# Patient Record
Sex: Female | Born: 1975 | Race: White | Hispanic: No | Marital: Married | Smoking: Former smoker
Health system: Southern US, Community
[De-identification: ages and names within clinical notes are randomized; demographics above are authoritative.]

## PROBLEM LIST (undated history)

## (undated) DIAGNOSIS — Z8619 Personal history of other infectious and parasitic diseases: Secondary | ICD-10-CM

## (undated) DIAGNOSIS — O139 Gestational [pregnancy-induced] hypertension without significant proteinuria, unspecified trimester: Secondary | ICD-10-CM

## (undated) DIAGNOSIS — M1990 Unspecified osteoarthritis, unspecified site: Secondary | ICD-10-CM

## (undated) DIAGNOSIS — E785 Hyperlipidemia, unspecified: Secondary | ICD-10-CM

## (undated) DIAGNOSIS — Z87440 Personal history of urinary (tract) infections: Secondary | ICD-10-CM

## (undated) HISTORY — DX: Hyperlipidemia, unspecified: E78.5

## (undated) HISTORY — DX: Gestational (pregnancy-induced) hypertension without significant proteinuria, unspecified trimester: O13.9

## (undated) HISTORY — PX: OTHER SURGICAL HISTORY: SHX169

## (undated) HISTORY — DX: Unspecified osteoarthritis, unspecified site: M19.90

## (undated) HISTORY — DX: Personal history of urinary (tract) infections: Z87.440

## (undated) HISTORY — DX: Personal history of other infectious and parasitic diseases: Z86.19

---

## 2006-03-16 ENCOUNTER — Encounter: Admission: RE | Admit: 2006-03-16 | Discharge: 2006-03-16 | Payer: Self-pay | Admitting: *Deleted

## 2006-08-14 ENCOUNTER — Encounter: Admission: RE | Admit: 2006-08-14 | Discharge: 2006-08-14 | Payer: Self-pay | Admitting: *Deleted

## 2007-02-13 ENCOUNTER — Encounter: Admission: RE | Admit: 2007-02-13 | Discharge: 2007-02-13 | Payer: Self-pay | Admitting: Obstetrics and Gynecology

## 2007-07-10 ENCOUNTER — Encounter: Admission: RE | Admit: 2007-07-10 | Discharge: 2007-07-10 | Payer: Self-pay | Admitting: Obstetrics and Gynecology

## 2009-03-27 ENCOUNTER — Inpatient Hospital Stay (HOSPITAL_COMMUNITY): Admission: AD | Admit: 2009-03-27 | Discharge: 2009-03-31 | Payer: Self-pay | Admitting: Obstetrics and Gynecology

## 2009-03-28 ENCOUNTER — Encounter (INDEPENDENT_AMBULATORY_CARE_PROVIDER_SITE_OTHER): Payer: Self-pay | Admitting: Obstetrics and Gynecology

## 2010-09-26 ENCOUNTER — Encounter: Payer: Self-pay | Admitting: *Deleted

## 2010-11-23 LAB — HEPATITIS B SURFACE ANTIGEN: Hepatitis B Surface Ag: NEGATIVE

## 2010-11-23 LAB — RUBELLA ANTIBODY, IGM: Rubella: IMMUNE

## 2010-11-23 LAB — ANTIBODY SCREEN: Antibody Screen: NEGATIVE

## 2010-12-12 LAB — CBC
HCT: 37.6 % (ref 36.0–46.0)
MCHC: 34.7 g/dL (ref 30.0–36.0)
MCHC: 35.4 g/dL (ref 30.0–36.0)
MCV: 90.6 fL (ref 78.0–100.0)
RBC: 3.42 MIL/uL — ABNORMAL LOW (ref 3.87–5.11)
RDW: 12.9 % (ref 11.5–15.5)
RDW: 13.3 % (ref 11.5–15.5)
WBC: 10.5 10*3/uL (ref 4.0–10.5)
WBC: 7.7 10*3/uL (ref 4.0–10.5)

## 2011-01-18 NOTE — H&P (Signed)
Natasha Wallace, Natasha Wallace                ACCOUNT NO.:  [id number]   MEDICAL RECORD NO.:  [id number]          PATIENT TYPE:  INP   LOCATION:                                FACILITY:  [facility]   PHYSICIAN:  Lenoard Aden, M.D.DATE OF BIRTH:  02-27-1976   DATE OF ADMISSION:  03/27/2009  DATE OF DISCHARGE:                              HISTORY & PHYSICAL   CHIEF COMPLAINT:  LGA with Bishop score greater than 8 for induction.   HISTORY OF PRESENT ILLNESS:  She is a 35 year old, white female, G2, P0  at 40 weeks and 0 days, who presents for Cervidil.  She has a history of  an estimated fetal weight greater than the 90th percentile, now  presenting for cervical ripening and induction.   ALLERGIES:  She has no known drug allergies.  No known LATEX allergies.   MEDICATIONS:  Prenatal vitamins are her only medication.   FAMILY HISTORY:  Hypertension, breast cancer, COPD.   SOCIAL HISTORY:  She is a nonsmoker, nondrinker.  She denies domestic or  physical violence.   SURGICAL HISTORY:  She has no relevant surgical history.  She has a  history of 1 TAB.   PRENATAL COURSE:  Complicated by size discrepancy with an estimated  fetal with persistent delay in the 90th percentile.  Most recent  ultrasound with an estimated fetal weight of 4000 g approximately 9  pound.   PHYSICAL EXAMINATION:  GENERAL:  She is a well-developed, well-  nourished, white female, in no acute distress.  HEENT:  Normal.  LUNGS:  Clear.  HEART:  Regular rate and rhythm.  ABDOMEN:  Soft, gravid, nontender.  Estimated fetal weight 9-9-1/2  pounds.  Cervix 2 cm, 70% vertex -1.  EXTREMITIES:  No cords.  NEURO:  Nonfocal.  SKIN:  Intact.   Cervidil was placed.  NST is reactive.   IMPRESSION:  A 40-week intrauterine pregnancy with Bishop score greater  than 8.  Estimated fetal weight greater than 90th percentile for  attempts at vaginal delivery.   PLAN:  Proceed with cautious attempts at vaginal delivery, epidural as  needed.      Lenoard Aden, M.D.  Electronically Signed     RJT/MEDQ  D:  03/27/2009  T:  03/28/2009  Job:  [id number]

## 2011-01-18 NOTE — Discharge Summary (Signed)
NAMESALONI, Natasha Wallace                ACCOUNT NO.:  [id number]   MEDICAL RECORD NO.:  [id number]          PATIENT TYPE:  INP   LOCATION:  9124                          [facility]ILITY:  [facility]   PHYSICIAN:  Lenoard Aden, M.D.DATE OF BIRTH:  May 07, 1976   DATE OF ADMISSION:  03/27/2009  DATE OF DISCHARGE:  03/31/2009                               DISCHARGE SUMMARY   The patient is a gravida 2, para 0 at [redacted] weeks gestation for induction  of labor, LGA at 90th percentile with an estimated fetal weight of 9  pounds.  Bishop score of 8, reactive NST.  The patient has received  prenatal care [facility] with Dr. Billy Coast as primary.  Pregnancy has  been uncomplicated except for measuring large for gestational age.  The  patient is B+, rubella positive, hepatitis negative, HIV negative, RPR  negative, and GBS negative.  The patient was admitted on March 27, 2009,  for induction of labor with Cervidil cervical ripening.  On March 28, 2009, the patient underwent artificial rupture of membranes at 4 cm  dilation at 10:15 a.m.  IUPC was placed.  The patient underwent Pitocin  augmentation.  At 14:30 decision for cesarean section for repetitive  variable decels, nonreassuring fetal heart rate, intolerance to Pitocin  augmentation.  See operative report by Dr. Billy Coast.  A viable female  delivered.  Birth weight of 9 pounds 15 ounces.   POSTOPERATIVE COURSE:  Postoperative course was uneventful.  Vital signs  remained normal limits.  The patient is afebrile during postoperative  stay.  Vital signs on day of discharge include temperature of 97.5,  pulse 83, respirations 18, and blood pressure 111/73.  Postoperative  CBC, white blood cell count of 10.5, hemoglobin 11, hematocrit 31, and a  platelet count of 170.  The patient was discharged home on postoperative  day #3 in stable condition.  The patient is ambulating, tolerating diet,  voiding quantity sufficient.  Physical exam is within normal limits.  Incision is intact with staples with no erythema and no drainage.  Staples were removed.  Edges were well approximated and Steri-Strips  were applied.   DISCHARGE INSTRUCTIONS:  Instructions per [facility] discharge  booklet.  The patient is on a regular diet.  Activity per postoperative  instruction.   DISCHARGE MEDICATIONS:  Medications at time of discharge include:  1. Prenatal vitamin 1 p.o. daily.  2. Motrin over the counter 2-4 tablets every 8 hours as needed for      pain p.r.n.  3. Percocet 1-2 tablets p.o. q.6 h. as needed for pain p.r.n.   The patient to remain in hospital, remaining with newborn on  postoperative day #4.  The patient was officially discharged on  postoperative day #3 in stable condition.     Marlinda Mike, C.N.M.      Lenoard Aden, M.D.  Electronically Signed   TB/MEDQ  D:  03/31/2009  T:  03/31/2009  Job:  [id number]

## 2011-01-18 NOTE — Op Note (Signed)
Natasha Wallace, Natasha Wallace                ACCOUNT NO.:  [id number]   MEDICAL RECORD NO.:  [id number]          PATIENT TYPE:  INP   LOCATION:  9124                          [facility]ILITY:  [facility]   PHYSICIAN:  Lenoard Aden, M.D.DATE OF BIRTH:  07-18-1976   DATE OF PROCEDURE:  03/28/2009  DATE OF DISCHARGE:                               OPERATIVE REPORT   PREOPERATIVE DIAGNOSES:  Large for gestational-age fetus, 40-week  intrauterine pregnancy, nonreassuring fetal heart rate tracing with  fetal bradycardia.   POSTOPERATIVE DIAGNOSES:  Large for gestational-age fetus, 40-week  intrauterine pregnancy, nonreassuring fetal heart rate tracing with  fetal bradycardia.   PROCEDURE:  Urgent low segment transverse cesarean section.   SURGEON:  Lenoard Aden, MD   ASSISTANT:  Dineen Kid. Rana Snare, MD   ANESTHESIA:  Epidural by Dr. Arby Barrette.   ESTIMATED BLOOD LOSS:  1000 mL.   COMPLICATIONS:  None.   DRAINS:  Foley.   COUNTS:  Correct.   The patient went to recovery in good condition.   FINDINGS:  Full-term living female, occiput anterior position, Apgars  pending.  Cord pH 7.16. Posterior placenta.  Normal tubes.  Normal  ovaries.  Two-layer uterine closure.   BRIEF OPERATIVE NOTE:  After being apprised of risks of anesthesia,  infection, bleeding, injury to abdominal organs, need for repair,  delayed versus immediate complications to include bowel and bladder  injury.  The patient was brought urgently to the operating room where  she was administered a dosing of epidural anesthetic.  Fetal heart tones  auscultated in 60-90 beats per minute range.  She was quickly prepped  and draped in the usual sterile fashion.  Foley catheter previously  placed.  Pfannenstiel incision was made with scalpel, carried down to  fascia, which was nicked in the midline and opened transverse using Mayo  scissors.  Rectus muscles were dissected bluntly.  Peritoneum was  entered bluntly.  Bladder was blade placed.   Visceral peritoneum scored  sharply off the lower uterine segment.  Kerr hysterotomy was incision  made, atraumatic delivery, 9 pounds 15 ounces female.  Apgars pending,  handed to pediatricians in attendance.  Cord blood collected.  Cord pH  7.16.  Placenta delivered manually intact, sent to Pathology.  Uterus  exteriorized, curetted using a dry lap pack.  Uterine atony was curetted  using a dilute Pitocin solution and intramuscular Methergine.  Good  hemostasis noted.  Irrigation was accomplished.  Two-layer uterine  closure with a 0 Monocryl suture running layer, second imbricating layer  was placed.  Bladder flap was inspected, found to be hemostatic.  Irrigation was accomplished.  Peritoneum was closed using 2-0 chromic in  a  continuous running fashion and 0 Monocryl was used to close the fascia  in a continuous running fashion.  Subcutaneous tissue was reapproximated  using 0 plain in a continuous running fashion.  Skin was closed using  staples.  The patient tolerated the procedure well and to recovery in  good condition.      Lenoard Aden, M.D.  Electronically Signed     RJT/MEDQ  D:  03/28/2009  T:  03/28/2009  Job:  [id number]

## 2011-06-23 ENCOUNTER — Other Ambulatory Visit: Payer: Self-pay | Admitting: Obstetrics and Gynecology

## 2011-06-29 ENCOUNTER — Encounter (HOSPITAL_COMMUNITY): Payer: Self-pay

## 2011-06-29 NOTE — Patient Instructions (Signed)
   Your procedure is scheduled WU:[id number], October 30th  Enter through the Main Entrance of [facility] at:9:30am Pick up the phone at the desk and dial [phone] and inform us of your arrival.  Please call this number if you have any problems the morning of surgery: [phone]  Remember: Do not eat food after midnight:Monday Do not drink clear liquids after:midnight Monday Take these medicines the morning of surgery with a SIP OF WATER:pepcid  Do not wear jewelry, make-up, or FINGER nail polish Do not wear lotions, powders, or perfumes.  You may not wear deodorant. Do not shave 48 hours prior to surgery. Do not bring valuables to the hospital.  Leave suitcase in the car. After Surgery it may be brought to your room. For patients being admitted to the hospital, checkout time is 11:00am the day of discharge.  Patients dis  Remember to use your hibiclens as instructed.Please shower with 1/2 bottle the evening before your surgery and the other 1/2 bottle the morning of surgery.

## 2011-07-01 ENCOUNTER — Encounter (HOSPITAL_COMMUNITY): Payer: Self-pay

## 2011-07-01 ENCOUNTER — Encounter (HOSPITAL_COMMUNITY)
Admission: RE | Admit: 2011-07-01 | Discharge: 2011-07-01 | Disposition: A | Discharge status: Home / Self Care | Payer: 59 | Source: Ambulatory Visit | Attending: Obstetrics and Gynecology | Admitting: Obstetrics and Gynecology

## 2011-07-01 LAB — CBC
HCT: 38.1 % (ref 36.0–46.0)
MCHC: 33.9 g/dL (ref 30.0–36.0)
MCV: 86.6 fL (ref 78.0–100.0)
RBC: 4.4 MIL/uL (ref 3.87–5.11)
WBC: 7.8 10*3/uL (ref 4.0–10.5)

## 2011-07-01 LAB — SURGICAL PCR SCREEN: Staphylococcus aureus: NEGATIVE

## 2011-07-04 MED ORDER — CEFAZOLIN SODIUM-DEXTROSE 2-3 GM-% IV SOLR
2.0000 g | INTRAVENOUS | Status: AC
  Administered 2011-07-05: 2 g via INTRAVENOUS
  Filled 2011-07-04: qty 50

## 2011-07-04 NOTE — H&P (Signed)
Natasha Wallace, Natasha Wallace                ACCOUNT NO.:  [id number]  MEDICAL RECORD NO.:  [id number]  LOCATION:                                 FACILITY:  PHYSICIAN:  Lenoard Aden, M.D.DATE OF BIRTH:  August 25, 1976  DATE OF ADMISSION:  07/05/2011 DATE OF DISCHARGE:                             HISTORY & PHYSICAL   CHIEF COMPLAINT:  Previous C-section for elective repeat at 39 weeks.  HISTORY OF PRESENT ILLNESS:  A 35 year old white female, G3, P1, at 68 weeks' gestation for repeat low-segment transverse cesarean section.  She has no known drug allergies.  Medications are prenatal vitamins.  She has a family history of liver cancer, COPD, chronic hypertension, and breast cancer.  She is a nonsmoker, nondrinker.  She denies domestic violence.  Previous TAB at 12 weeks.  Previous primary C-section in 2010.  PHYSICAL EXAMINATION:  GENERAL:  This is a well-developed, well- nourished, white female, in no acute distress. HEENT:  Normal. NECK:  Supple.  Full range of motion. LUNGS:  Clear. HEART:  Regular rhythm. ABDOMEN:  Soft, gravid, nontender.  Estimated fetal weight 8 pounds. Cervix is closed, 50%, vertex -2. EXTREMITIES:  No cords. NEUROLOGIC:  Nonfocal. SKIN:  Intact.  IMPRESSION:  A 39-week intrauterine pregnancy for elective repeat C- section.  PLAN:  Elective repeat C-section.  Risks of anesthesia, infection, bleeding, injury to abdominal organs, need for repair was discussed, delayed versus immediate complications to include bowel and bladder injury noted.  The patient acknowledges and wishes to proceed.     Lenoard Aden, M.D.     RJT/MEDQ  D:  07/04/2011  T:  07/04/2011  Job:  [id number]  cc:   Ma Hillock

## 2011-07-05 ENCOUNTER — Encounter (HOSPITAL_COMMUNITY): Payer: Self-pay | Admitting: Anesthesiology

## 2011-07-05 ENCOUNTER — Inpatient Hospital Stay (HOSPITAL_COMMUNITY)
Admission: RE | Admit: 2011-07-05 | Discharge: 2011-07-07 | DRG: 766 | Disposition: A | Discharge status: Home / Self Care | Payer: 59 | Source: Ambulatory Visit | Attending: Obstetrics and Gynecology | Admitting: Obstetrics and Gynecology

## 2011-07-05 ENCOUNTER — Encounter (HOSPITAL_COMMUNITY): Payer: Self-pay | Admitting: *Deleted

## 2011-07-05 ENCOUNTER — Encounter (HOSPITAL_COMMUNITY)
Admission: RE | Disposition: A | Discharge status: Home / Self Care | Payer: Self-pay | Source: Ambulatory Visit | Attending: Obstetrics and Gynecology

## 2011-07-05 ENCOUNTER — Inpatient Hospital Stay (HOSPITAL_COMMUNITY): Payer: 59 | Admitting: Anesthesiology

## 2011-07-05 DIAGNOSIS — Z01812 Encounter for preprocedural laboratory examination: Secondary | ICD-10-CM

## 2011-07-05 DIAGNOSIS — O34219 Maternal care for unspecified type scar from previous cesarean delivery: Principal | ICD-10-CM | POA: Diagnosis present

## 2011-07-05 DIAGNOSIS — Z01818 Encounter for other preprocedural examination: Secondary | ICD-10-CM

## 2011-07-05 LAB — RPR: RPR Ser Ql: NONREACTIVE

## 2011-07-05 SURGERY — Surgical Case
Anesthesia: Spinal | Site: Uterus | Wound class: Clean Contaminated

## 2011-07-05 MED ORDER — BUPIVACAINE HCL (PF) 0.25 % IJ SOLN
INTRAMUSCULAR | Status: DC | PRN
  Administered 2011-07-05: 10 mL

## 2011-07-05 MED ORDER — KETOROLAC TROMETHAMINE 30 MG/ML IJ SOLN: 30.0000 mg | Freq: Four times a day (QID) | INTRAMUSCULAR | Status: AC | PRN

## 2011-07-05 MED ORDER — ONDANSETRON HCL 4 MG/2ML IJ SOLN
INTRAMUSCULAR | Status: AC
  Filled 2011-07-05: qty 2

## 2011-07-05 MED ORDER — NALBUPHINE HCL 10 MG/ML IJ SOLN
5.0000 mg | INTRAMUSCULAR | Status: DC | PRN
  Administered 2011-07-05 – 2011-07-06 (×2): 10 mg via INTRAVENOUS
  Filled 2011-07-05 (×2): qty 1

## 2011-07-05 MED ORDER — BUPIVACAINE IN DEXTROSE 0.75-8.25 % IT SOLN
INTRATHECAL | Status: DC | PRN
  Administered 2011-07-05: 1.6 mL via INTRATHECAL

## 2011-07-05 MED ORDER — OXYTOCIN 10 UNIT/ML IJ SOLN
INTRAMUSCULAR | Status: AC
  Filled 2011-07-05: qty 2

## 2011-07-05 MED ORDER — SODIUM CHLORIDE 0.9 % IV SOLN: 1.0000 ug/kg/h | INTRAVENOUS | Status: DC | PRN

## 2011-07-05 MED ORDER — LANOLIN HYDROUS EX OINT: 1.0000 "application " | TOPICAL_OINTMENT | CUTANEOUS | Status: DC | PRN

## 2011-07-05 MED ORDER — METOCLOPRAMIDE HCL 5 MG/ML IJ SOLN: 10.0000 mg | Freq: Three times a day (TID) | INTRAMUSCULAR | Status: DC | PRN

## 2011-07-05 MED ORDER — SIMETHICONE 80 MG PO CHEW: 80.0000 mg | CHEWABLE_TABLET | ORAL | Status: DC | PRN

## 2011-07-05 MED ORDER — ZOLPIDEM TARTRATE 5 MG PO TABS: 5.0000 mg | ORAL_TABLET | Freq: Every evening | ORAL | Status: DC | PRN

## 2011-07-05 MED ORDER — DIBUCAINE 1 % RE OINT: 1.0000 "application " | TOPICAL_OINTMENT | RECTAL | Status: DC | PRN

## 2011-07-05 MED ORDER — EPHEDRINE 5 MG/ML INJ
INTRAVENOUS | Status: AC
  Filled 2011-07-05: qty 10

## 2011-07-05 MED ORDER — MORPHINE SULFATE 0.5 MG/ML IJ SOLN
INTRAMUSCULAR | Status: AC
  Filled 2011-07-05: qty 10

## 2011-07-05 MED ORDER — SIMETHICONE 80 MG PO CHEW
80.0000 mg | CHEWABLE_TABLET | Freq: Three times a day (TID) | ORAL | Status: DC
  Administered 2011-07-05 – 2011-07-07 (×6): 80 mg via ORAL

## 2011-07-05 MED ORDER — TETANUS-DIPHTH-ACELL PERTUSSIS 5-2.5-18.5 LF-MCG/0.5 IM SUSP: 0.5000 mL | Freq: Once | INTRAMUSCULAR | Status: DC

## 2011-07-05 MED ORDER — ONDANSETRON HCL 4 MG/2ML IJ SOLN
INTRAMUSCULAR | Status: DC | PRN
  Administered 2011-07-05: 4 mg via INTRAVENOUS

## 2011-07-05 MED ORDER — METHYLERGONOVINE MALEATE 0.2 MG/ML IJ SOLN: 0.2000 mg | INTRAMUSCULAR | Status: DC | PRN

## 2011-07-05 MED ORDER — LACTATED RINGERS IV SOLN
INTRAVENOUS | Status: DC
  Administered 2011-07-05: 19:00:00 via INTRAVENOUS

## 2011-07-05 MED ORDER — NALOXONE HCL 0.4 MG/ML IJ SOLN: 0.4000 mg | INTRAMUSCULAR | Status: DC | PRN

## 2011-07-05 MED ORDER — SODIUM CHLORIDE 0.9 % IJ SOLN: 3.0000 mL | INTRAMUSCULAR | Status: DC | PRN

## 2011-07-05 MED ORDER — OXYTOCIN 20 UNITS IN LACTATED RINGERS INFUSION - SIMPLE
INTRAVENOUS | Status: DC | PRN
  Administered 2011-07-05: 40 [IU] via INTRAVENOUS

## 2011-07-05 MED ORDER — EPHEDRINE SULFATE 50 MG/ML IJ SOLN
INTRAMUSCULAR | Status: DC | PRN
  Administered 2011-07-05 (×2): 10 mg via INTRAVENOUS

## 2011-07-05 MED ORDER — IBUPROFEN 600 MG PO TABS: 600.0000 mg | ORAL_TABLET | Freq: Four times a day (QID) | ORAL | Status: DC | PRN

## 2011-07-05 MED ORDER — DIPHENHYDRAMINE HCL 50 MG/ML IJ SOLN
12.5000 mg | INTRAMUSCULAR | Status: DC | PRN
  Administered 2011-07-05: 12.5 mg via INTRAVENOUS
  Filled 2011-07-05: qty 1

## 2011-07-05 MED ORDER — MENTHOL 3 MG MT LOZG
1.0000 | LOZENGE | OROMUCOSAL | Status: DC | PRN
  Filled 2011-07-05: qty 9

## 2011-07-05 MED ORDER — ONDANSETRON HCL 4 MG PO TABS: 4.0000 mg | ORAL_TABLET | ORAL | Status: DC | PRN

## 2011-07-05 MED ORDER — PHENYLEPHRINE 40 MCG/ML (10ML) SYRINGE FOR IV PUSH (FOR BLOOD PRESSURE SUPPORT)
PREFILLED_SYRINGE | INTRAVENOUS | Status: AC
  Filled 2011-07-05: qty 5

## 2011-07-05 MED ORDER — NALBUPHINE SYRINGE 5 MG/0.5 ML
2.5000 mg | INJECTION | INTRAMUSCULAR | Status: DC | PRN
  Administered 2011-07-05: 2.5 mg via INTRAVENOUS

## 2011-07-05 MED ORDER — OXYCODONE-ACETAMINOPHEN 5-325 MG PO TABS
1.0000 | ORAL_TABLET | ORAL | Status: DC | PRN
  Administered 2011-07-06: 1 via ORAL
  Filled 2011-07-05 (×3): qty 1

## 2011-07-05 MED ORDER — FENTANYL CITRATE 0.05 MG/ML IJ SOLN
INTRAMUSCULAR | Status: DC | PRN
  Administered 2011-07-05: 25 ug via INTRATHECAL

## 2011-07-05 MED ORDER — LACTATED RINGERS IV SOLN
INTRAVENOUS | Status: DC
  Administered 2011-07-05 (×3): via INTRAVENOUS

## 2011-07-05 MED ORDER — METHYLERGONOVINE MALEATE 0.2 MG PO TABS: 0.2000 mg | ORAL_TABLET | ORAL | Status: DC | PRN

## 2011-07-05 MED ORDER — MEPERIDINE HCL 25 MG/ML IJ SOLN: 6.2500 mg | INTRAMUSCULAR | Status: DC | PRN

## 2011-07-05 MED ORDER — PRENATAL PLUS 27-1 MG PO TABS
1.0000 | ORAL_TABLET | Freq: Every day | ORAL | Status: DC
  Administered 2011-07-06 – 2011-07-07 (×2): 1 via ORAL
  Filled 2011-07-05 (×2): qty 1

## 2011-07-05 MED ORDER — ONDANSETRON HCL 4 MG/2ML IJ SOLN: 4.0000 mg | Freq: Three times a day (TID) | INTRAMUSCULAR | Status: DC | PRN

## 2011-07-05 MED ORDER — DIPHENHYDRAMINE HCL 25 MG PO CAPS
25.0000 mg | ORAL_CAPSULE | ORAL | Status: DC | PRN
  Filled 2011-07-05: qty 1

## 2011-07-05 MED ORDER — MORPHINE SULFATE (PF) 0.5 MG/ML IJ SOLN
INTRAMUSCULAR | Status: DC | PRN
  Administered 2011-07-05: .2 mg via INTRATHECAL

## 2011-07-05 MED ORDER — KETOROLAC TROMETHAMINE 60 MG/2ML IM SOLN
60.0000 mg | Freq: Once | INTRAMUSCULAR | Status: AC | PRN
  Filled 2011-07-05: qty 2

## 2011-07-05 MED ORDER — ONDANSETRON HCL 4 MG/2ML IJ SOLN: 4.0000 mg | INTRAMUSCULAR | Status: DC | PRN

## 2011-07-05 MED ORDER — PHENYLEPHRINE HCL 10 MG/ML IJ SOLN
INTRAMUSCULAR | Status: DC | PRN
  Administered 2011-07-05: 40 ug via INTRAVENOUS
  Administered 2011-07-05 (×2): 80 ug via INTRAVENOUS

## 2011-07-05 MED ORDER — NALBUPHINE HCL 10 MG/ML IJ SOLN: 5.0000 mg | INTRAMUSCULAR | Status: DC | PRN

## 2011-07-05 MED ORDER — FENTANYL CITRATE 0.05 MG/ML IJ SOLN
INTRAMUSCULAR | Status: AC
  Filled 2011-07-05: qty 2

## 2011-07-05 MED ORDER — WITCH HAZEL-GLYCERIN EX PADS: 1.0000 "application " | MEDICATED_PAD | CUTANEOUS | Status: DC | PRN

## 2011-07-05 MED ORDER — IBUPROFEN 600 MG PO TABS
600.0000 mg | ORAL_TABLET | Freq: Four times a day (QID) | ORAL | Status: DC
  Administered 2011-07-05 – 2011-07-07 (×7): 600 mg via ORAL
  Filled 2011-07-05 (×8): qty 1

## 2011-07-05 MED ORDER — SENNOSIDES-DOCUSATE SODIUM 8.6-50 MG PO TABS
2.0000 | ORAL_TABLET | Freq: Every day | ORAL | Status: DC
  Administered 2011-07-05 – 2011-07-06 (×2): 2 via ORAL

## 2011-07-05 MED ORDER — DIPHENHYDRAMINE HCL 25 MG PO CAPS
25.0000 mg | ORAL_CAPSULE | Freq: Four times a day (QID) | ORAL | Status: DC | PRN
  Administered 2011-07-06: 25 mg via ORAL

## 2011-07-05 MED ORDER — OXYTOCIN 20 UNITS IN LACTATED RINGERS INFUSION - SIMPLE
125.0000 mL/h | INTRAVENOUS | Status: AC
  Administered 2011-07-05: 125 mL/h via INTRAVENOUS

## 2011-07-05 MED ORDER — SCOPOLAMINE 1 MG/3DAYS TD PT72: 1.0000 | MEDICATED_PATCH | Freq: Once | TRANSDERMAL | Status: DC

## 2011-07-05 MED ORDER — DIPHENHYDRAMINE HCL 50 MG/ML IJ SOLN: 25.0000 mg | INTRAMUSCULAR | Status: DC | PRN

## 2011-07-05 MED ORDER — SCOPOLAMINE 1 MG/3DAYS TD PT72
1.0000 | MEDICATED_PATCH | Freq: Once | TRANSDERMAL | Status: DC
  Administered 2011-07-05: 1.5 mg via TRANSDERMAL

## 2011-07-05 MED ORDER — OXYTOCIN 20 UNITS IN LACTATED RINGERS INFUSION - SIMPLE
INTRAVENOUS | Status: AC
  Filled 2011-07-05: qty 1000

## 2011-07-05 MED ORDER — NALBUPHINE SYRINGE 5 MG/0.5 ML
INJECTION | INTRAMUSCULAR | Status: AC
  Administered 2011-07-05: 2.5 mg via INTRAVENOUS
  Filled 2011-07-05: qty 0.5

## 2011-07-05 SURGICAL SUPPLY — 31 items
CLOTH BEACON ORANGE TIMEOUT ST (SAFETY) ×2 IMPLANT
CONTAINER PREFILL 10% NBF 15ML (MISCELLANEOUS) IMPLANT
DRESSING TELFA 8X3 (GAUZE/BANDAGES/DRESSINGS) IMPLANT
DRSG COVADERM 4X10 (GAUZE/BANDAGES/DRESSINGS) ×1 IMPLANT
ELECT REM PT RETURN 9FT ADLT (ELECTROSURGICAL) ×2
ELECTRODE REM PT RTRN 9FT ADLT (ELECTROSURGICAL) ×1 IMPLANT
EXTRACTOR VACUUM M CUP 4 TUBE (SUCTIONS) IMPLANT
GAUZE SPONGE 4X4 12PLY STRL LF (GAUZE/BANDAGES/DRESSINGS) ×4 IMPLANT
GLOVE BIO SURGEON STRL SZ7.5 (GLOVE) ×4 IMPLANT
GOWN PREVENTION PLUS LG XLONG (DISPOSABLE) ×4 IMPLANT
GOWN PREVENTION PLUS XLARGE (GOWN DISPOSABLE) ×2 IMPLANT
KIT ABG SYR 3ML LUER SLIP (SYRINGE) IMPLANT
NDL HYPO 25X1 1.5 SAFETY (NEEDLE) ×1 IMPLANT
NDL HYPO 25X5/8 SAFETYGLIDE (NEEDLE) IMPLANT
NEEDLE HYPO 25X1 1.5 SAFETY (NEEDLE) ×2 IMPLANT
NEEDLE HYPO 25X5/8 SAFETYGLIDE (NEEDLE) IMPLANT
NS IRRIG 1000ML POUR BTL (IV SOLUTION) ×2 IMPLANT
PACK C SECTION WH (CUSTOM PROCEDURE TRAY) ×2 IMPLANT
PAD ABD 7.5X8 STRL (GAUZE/BANDAGES/DRESSINGS) IMPLANT
SLEEVE SCD COMPRESS KNEE MED (MISCELLANEOUS) IMPLANT
STAPLER VISISTAT 35W (STAPLE) ×2 IMPLANT
SUT MNCRL 0 VIOLET CTX 36 (SUTURE) ×2 IMPLANT
SUT MON AB 2-0 CT1 27 (SUTURE) ×2 IMPLANT
SUT MON AB-0 CT1 36 (SUTURE) ×4 IMPLANT
SUT MONOCRYL 0 CTX 36 (SUTURE) ×2
SUT PLAIN 0 NONE (SUTURE) IMPLANT
SUT PLAIN 2 0 XLH (SUTURE) ×1 IMPLANT
SYR CONTROL 10ML LL (SYRINGE) ×2 IMPLANT
TOWEL OR 17X24 6PK STRL BLUE (TOWEL DISPOSABLE) ×4 IMPLANT
TRAY FOLEY CATH 14FR (SET/KITS/TRAYS/PACK) ×2 IMPLANT
WATER STERILE IRR 1000ML POUR (IV SOLUTION) ×2 IMPLANT

## 2011-07-05 NOTE — Anesthesia Postprocedure Evaluation (Signed)
Anesthesia Post Note  Patient: Natasha Wallace  Procedure(s) Performed:  CESAREAN SECTION  Anesthesia type: Spinal  Patient location: PACU  Post pain: Pain level controlled  Post assessment: Post-op Vital signs reviewed  Last Vitals:  Filed Vitals:   07/05/11 1210  BP: 103/56  Pulse:   Temp: 36.6 C  Resp: 20    Post vital signs: Reviewed  Level of consciousness: awake  Complications: No apparent anesthesia complications

## 2011-07-05 NOTE — Addendum Note (Signed)
Addendum  created 07/05/11 1729 by Karleen Dolphin   Modules edited:Notes Section

## 2011-07-05 NOTE — Anesthesia Postprocedure Evaluation (Signed)
  Anesthesia Post-op Note  Patient: Natasha Wallace  Procedure(s) Performed:  CESAREAN SECTION  Patient Location: 102  Anesthesia Type: Spinal  Level of Consciousness: awake, alert  and oriented  Airway and Oxygen Therapy: Patient Spontanous Breathing  Post-op Pain: mild  Post-op Assessment: Post-op Vital signs reviewed, Patient's Cardiovascular Status Stable, No headache, No backache, No residual numbness and No residual motor weakness  Post-op Vital Signs: Reviewed and stable  Complications: No apparent anesthesia complications

## 2011-07-05 NOTE — Progress Notes (Signed)
  Update done. Pt examined. Consent signed and witnessed.

## 2011-07-05 NOTE — Anesthesia Preprocedure Evaluation (Signed)
Anesthesia Evaluation  Patient identified by MRN, date of birth, ID band Patient awake  General Assessment Comment  Reviewed: Allergy & Precautions, H&P , Patient's Chart, lab work & pertinent test results  History of Anesthesia Complications (+) PONV  Airway Mallampati: II TM Distance: >3 FB Neck ROM: Full    Dental No notable dental hx. (+) Teeth Intact   Pulmonary  clear to auscultation  Pulmonary exam normal       Cardiovascular Regular Normal    Neuro/Psych Negative Neurological ROS  Negative Psych ROS   GI/Hepatic negative GI ROS Neg liver ROS  Medicated and Controlled  Endo/Other  Negative Endocrine ROS  Renal/GU negative Renal ROS  Genitourinary negative   Musculoskeletal   Abdominal   Peds  Hematology negative hematology ROS (+)   Anesthesia Other Findings   Reproductive/Obstetrics (+) Pregnancy                           Anesthesia Physical Anesthesia Plan  ASA: II  Anesthesia Plan: Spinal   Post-op Pain Management:    Induction:   Airway Management Planned:   Additional Equipment:   Intra-op Plan:   Post-operative Plan:   Informed Consent: I have reviewed the patients History and Physical, chart, labs and discussed the procedure including the risks, benefits and alternatives for the proposed anesthesia with the patient or authorized representative who has indicated his/her understanding and acceptance.     Plan Discussed with: Anesthesiologist, CRNA and Surgeon  Anesthesia Plan Comments:         Anesthesia Quick Evaluation

## 2011-07-05 NOTE — Op Note (Signed)
Cesarean Section Procedure Note  Indications: Previous Cesarean Section x 1  Pre-operative Diagnosis: 39 week 3 day pregnancy.  Post-operative Diagnosis: same  Surgeon: Lenoard Aden   Assistants: Fredric Mare, CNM  Anesthesia: Spinal anesthesia  ASA Class: 2  Procedure Details  The patient was seen in the Holding Room. The risks, benefits, complications, treatment options, and expected outcomes were discussed with the patient.  The patient concurred with the proposed plan, giving informed consent. The risks of anesthesia, infection, bleeding and possible injury to other organs discussed. Injury to bowel, bladder, or ureter with possible need for repair discussed. Possible need for transfusion with secondary risks of hepatitis or HIV acquisition discussed. Post operative complications to include but not limited to DVT, PE and Pneumonia noted. The site of surgery properly noted/marked. The patient was taken to Operating Room # 2, identified as Natasha Wallace and the procedure verified as C-Section Delivery. A Time Out was held and the above information confirmed.  After induction of anesthesia, the patient was draped and prepped in the usual sterile manner. A Pfannenstiel incision was made and carried down through the subcutaneous tissue to the fascia. Fascial incision was made and extended transversely using Mayo scissors. The fascia was separated from the underlying rectus tissue superiorly and inferiorly. The peritoneum was identified and entered. Peritoneal incision was extended longitudinally. The utero-vesical peritoneal reflection was incised transversely and the bladder flap was bluntly freed from the lower uterine segment. A low transverse uterine incision(Kerr hysterotomy) was made. Delivered from OT presentation was a  female with Apgar scores of 9 at one minute and 9 at five minutes. After the umbilical cord was clamped and cut cord blood was obtained for evaluation. The placenta was  removed intact and appeared normal. The uterine outline, tubes and ovaries appeared normal. The uterine incision was closed with running locked sutures of 0 Monocryl x 2 layers. Hemostasis was observed. Lavage was carried out until clear. The fascia was then reapproximated with running sutures of 0 Monocryl. Subcutaneous tissue re-approximated with a 2-0 plain suture.The skin was reapproximated with staples.  Instrument, sponge, and needle counts were correct prior the abdominal closure and at the conclusion of the case.   Findings: FTLF, Apgars as noted.   Estimated Blood Loss:          Drains: Foley                 Specimens: Placenta                 Complications:  None; patient tolerated the procedure well.         Disposition: PACU - hemodynamically stable.         Condition: stable  Attending Attestation: I performed the procedure.

## 2011-07-05 NOTE — Transfer of Care (Signed)
Immediate Anesthesia Transfer of Care Note  Patient: Natasha Wallace  Procedure(s) Performed:  CESAREAN SECTION  Patient Location: PACU  Anesthesia Type: Spinal  Level of Consciousness: awake  Airway & Oxygen Therapy: Patient Spontanous Breathing  Post-op Assessment: Report given to PACU RN  Post vital signs: Reviewed and stable  Complications: No apparent anesthesia complications

## 2011-07-05 NOTE — Consult Note (Signed)
Neonatology Note:   Attendance at C-section:    I was asked to attend this repeat C/S at term. The mother is a G3P1A1 B pos, GBS unknown with an uncomplicated pregnancy. ROM at delivery, fluid clear. Infant vigorous with good spontaneous cry and tone. Needed only minimal bulb suctioning. Ap 9/9. Lungs clear to ausc in DR. To CN to care of Pediatrician.   Deatra James, MD

## 2011-07-05 NOTE — Anesthesia Procedure Notes (Signed)
Spinal Block  Patient location during procedure: OR Start time: 07/05/2011 11:19 AM End time: 07/05/2011 11:24 AM Staffing Anesthesiologist: Sandrea Hughs Performed by: anesthesiologist  Preanesthetic Checklist Completed: patient identified, site marked, surgical consent, pre-op evaluation, timeout performed, IV checked, risks and benefits discussed and monitors and equipment checked Spinal Block Patient position: sitting Prep: DuraPrep Patient monitoring: heart rate, cardiac monitor, continuous pulse ox and blood pressure Approach: midline Location: L3-4 Injection technique: single-shot Needle Needle type: Sprotte  Needle gauge: 24 G Needle length: 9 cm Needle insertion depth: 8 cm Assessment Sensory level: T4

## 2011-07-06 ENCOUNTER — Encounter (HOSPITAL_COMMUNITY): Payer: Self-pay | Admitting: Obstetrics and Gynecology

## 2011-07-06 LAB — CBC
MCHC: 34 g/dL (ref 30.0–36.0)
Platelets: 211 10*3/uL (ref 150–400)
RDW: 13.9 % (ref 11.5–15.5)
WBC: 10.7 10*3/uL — ABNORMAL HIGH (ref 4.0–10.5)

## 2011-07-06 MED ORDER — BISACODYL 10 MG RE SUPP
10.0000 mg | Freq: Once | RECTAL | Status: AC
  Administered 2011-07-06: 10 mg via RECTAL
  Filled 2011-07-06: qty 1

## 2011-07-06 NOTE — Progress Notes (Signed)
  POD # 1  Subjective: Pt reports feeling well/Mod pain control with prescription NSAID's including motrin.  She has not taken any percocet at this point Tolerating po/ Foley d/c'ed approx 5 hrs ago.  Has not voided yet/ No n/v/Flatus neg Activity: up with assistance Bleeding is light Newborn info:  Information for the patient's newborn:  Masaki, Girl Flordia [[id number]]  female  / Feeding: bottle   Objective: VS: Blood pressure 105/68, pulse 93, temperature 97.9 F (36.6 C), temperature source Oral, resp. rate 18 LABS:  Basename 07/06/11 0535  HGB 10.7*  HCT 31.5*     Basename 07/06/11 0535  WBC 10.7*  HGB 10.7*  HCT 31.5*  PLT 211    Blood type: B/Positive/-- (03/20 0000) Rubella: Immune (03/20 0000)    Physical Exam:  General: alert, cooperative and no distress CV: Regular rate and rhythm Resp: clear Abdomen: soft, nontender, normal bowel sounds Uterine Fundus: firm, below umbilicus, nontender Perineum: not inspected Lochia: minimal Ext: edema +1-2 pretib and Homans sign is negative, no sign of DVT    A/P: POD # 1/ G3P1011 Doing well; advance activity; improve pain control Continue routine post op orders

## 2011-07-07 MED ORDER — OXYCODONE-ACETAMINOPHEN 5-325 MG PO TABS: 1.0000 | ORAL_TABLET | ORAL | Status: AC | PRN

## 2011-07-07 MED ORDER — IBUPROFEN 600 MG PO TABS: 600.0000 mg | ORAL_TABLET | Freq: Four times a day (QID) | ORAL | Status: AC

## 2011-07-07 MED ORDER — HYDROCHLOROTHIAZIDE 12.5 MG PO TABS: 25.0000 mg | ORAL_TABLET | Freq: Every day | ORAL | Status: DC

## 2011-07-07 MED ORDER — HYDROCORTISONE 2.5 % RE CREA: TOPICAL_CREAM | RECTAL | Status: DC

## 2011-07-07 NOTE — Progress Notes (Signed)
  S:         Reports feeling well -better than yesterday with good results from suppository for gas pains             Tolerating po intake / noo nausea / no vomiting / + flatus / + BM             Bleeding is light / mild hemorrhoids / c/o feet and hands really swollen and painful             Pain controlledprescription NSAID's including motrin and narcotic analgesics including percocet             Up ad lib / ambulatory  Newborn bottle feeding  / female  O:   A & O x 3 NAD / up in room ambulatory             VS: Blood pressure 111/71, pulse 90, temperature 98.2 F (36.8 C), temperature source Oral, resp. rate 20, weight 98.431 kg (217 lb), last menstrual period 10/03/2010, SpO2 97.00%.  LABS:  Lab Results  Component Value Date   WBC 10.7* 07/06/2011   HGB 10.7* 07/06/2011   HCT 31.5* 07/06/2011   MCV 87.5 07/06/2011   PLT 211 07/06/2011     I&O: I/O last 3 completed shifts: In: 1600 [P.O.:600; I.V.:1000] Out: 1000 [Urine:1000]     Net + balance             Breasts: no engorgement yet - no bra in place  Lungs: Clear and unlabored  Heart: regular rate and rhythm / no mumurs  Abdomen: soft, non-tender, non-distended, active BS             Fundus: firm, non-tender, Ueven             Dressing OFF              Incision:  approximated with staples / no erythema / mild ecchymosis / no drainage  Perineum: mild edema  Lochia: scant / spotting  Extremities: 1+ edema, no calf pain or tenderness, negative Homans  A:        POD # 3 S/P repeat cesarean section            Dependent edema            Bottlefeeding  P:         Routine postoperative care              Discharge home today             HCTZ x 5 days - push water hydration             Reviewed breast engorgement - comfort measures / signs to call             Staple removal at [facility] on day 7   Natasha Wallace 07/07/2011, 9:08 AM

## 2011-07-07 NOTE — Discharge Summary (Signed)
Obstetric Discharge Summary Reason for Admission: cesarean section - scheduled repeat Prenatal Procedures: none Intrapartum Procedures: cesarean: low cervical, transverse Postpartum Procedures: none Complications-Operative and Postpartum: none Hemoglobin  Date Value Range Status  07/06/2011 10.7* 12.0-15.0 (g/dL) Final     HCT  Date Value Range Status  07/06/2011 31.5* 36.0-46.0 (%) Final    Discharge Diagnoses: Term Pregnancy-delivered and dependent edema  Discharge Information: Date: 07/07/2011 Activity: pelvic rest x 6 weeks and postoperative restrictions x 2 weeks Diet: routine Medications: PNV, Ibuprofen, Percocet and HCTZ x 5 days Condition: stable Instructions: refer to practice specific booklet Discharge to: home Follow-up Information    Follow up with Lenoard Aden, MD. Make an appointment in 6 weeks.   Contact information:   [address] [place] [address] [phone]          Newborn Data: Live born female  Birth Weight: 8 lb 2.9 oz (3711 g) APGAR: 9, 9  Home with mother.  Tashari Schoenfelder 07/07/2011, 9:21 AM

## 2011-07-08 ENCOUNTER — Encounter (HOSPITAL_COMMUNITY): Payer: Self-pay | Admitting: *Deleted

## 2012-09-20 ENCOUNTER — Encounter (HOSPITAL_COMMUNITY): Payer: Self-pay | Admitting: Pharmacist

## 2012-09-20 ENCOUNTER — Other Ambulatory Visit: Payer: Self-pay | Admitting: Obstetrics and Gynecology

## 2012-09-20 ENCOUNTER — Encounter (HOSPITAL_COMMUNITY): Payer: Self-pay | Admitting: *Deleted

## 2012-09-26 NOTE — H&P (Signed)
NAMECADYNCE, GARRETTE NO.:  [id number]  MEDICAL RECORD NO.:  [id number]  LOCATION:  PERIO                         FACILITY:  [facility]  PHYSICIAN:  Lenoard Aden, M.D.DATE OF BIRTH:  [birth year]  DATE OF ADMISSION:  09/18/2012 DATE OF DISCHARGE:                             HISTORY & PHYSICAL   CHIEF COMPLAINT:  Desire for elective sterilization.  She is a 37 year old white female G2, P2, history of C-section x2, who presents for elective sterilization.  MEDICATIONS:  She has no medications.  ALLERGIES:  No known drug allergies.  FAMILY HISTORY:  COPD, breast cancer, liver cancer, chronic hypertension.  SURGICAL HISTORY:  Remarkable for C-section x2 and abortion x1.  PHYSICAL EXAMINATION:  GENERAL:  Well-developed, well-nourished white female, in no acute distress. VITAL SIGNS:  Height __________ 190 pounds HEENT:  Normal. NECK:  Supple.  Full range of motion. LUNGS:  Clear. HEART:  Regular rhythm. ABDOMEN:  Soft and nontender. PELVIC:  Reveals the uterus to be__________.  No adnexal masses. EXTREMITIES:  There are no cords. NEUROLOGIC:  Nonfocal. SKIN:  Intact.  IMPRESSION:  Desire for elective sterilization.  PLAN:  Proceed with Essure tubal sterilization method.  Risks of anesthesia, infection, bleeding, injury to abdominal organs were discussed.  Delayed versus immediate complications include bowel or bladder injury.  The patient acknowledges and wishes to proceed.     Lenoard Aden, M.D.    RJT/MEDQ  D:  09/26/2012  T:  09/26/2012  Job:  [id number]

## 2012-09-27 ENCOUNTER — Encounter (HOSPITAL_COMMUNITY): Payer: Self-pay | Admitting: Anesthesiology

## 2012-09-27 ENCOUNTER — Ambulatory Visit (HOSPITAL_COMMUNITY): Admission: RE | Admit: 2012-09-27 | Payer: 59 | Source: Ambulatory Visit | Admitting: Obstetrics and Gynecology

## 2012-09-27 ENCOUNTER — Encounter (HOSPITAL_COMMUNITY): Admission: RE | Payer: Self-pay | Source: Ambulatory Visit

## 2012-09-27 SURGERY — DILATATION & CURETTAGE/HYSTEROSCOPY WITH ESSURE
Anesthesia: Choice

## 2013-10-15 ENCOUNTER — Other Ambulatory Visit: Payer: Self-pay

## 2013-10-15 DIAGNOSIS — Z1231 Encounter for screening mammogram for malignant neoplasm of breast: Secondary | ICD-10-CM

## 2013-10-15 DIAGNOSIS — Z803 Family history of malignant neoplasm of breast: Secondary | ICD-10-CM

## 2013-11-07 ENCOUNTER — Ambulatory Visit
Admission: RE | Admit: 2013-11-07 | Discharge: 2013-11-07 | Disposition: A | Discharge status: Home / Self Care | Payer: 59 | Source: Ambulatory Visit

## 2013-11-07 DIAGNOSIS — Z1231 Encounter for screening mammogram for malignant neoplasm of breast: Secondary | ICD-10-CM

## 2013-11-07 DIAGNOSIS — Z803 Family history of malignant neoplasm of breast: Secondary | ICD-10-CM

## 2014-07-07 ENCOUNTER — Encounter (HOSPITAL_COMMUNITY): Payer: Self-pay | Admitting: *Deleted

## 2014-09-23 ENCOUNTER — Other Ambulatory Visit: Payer: Self-pay

## 2014-09-23 DIAGNOSIS — Z1231 Encounter for screening mammogram for malignant neoplasm of breast: Secondary | ICD-10-CM

## 2014-11-10 ENCOUNTER — Ambulatory Visit
Admission: RE | Admit: 2014-11-10 | Discharge: 2014-11-10 | Disposition: A | Discharge status: Home / Self Care | Payer: 59 | Source: Ambulatory Visit

## 2014-11-10 DIAGNOSIS — Z1231 Encounter for screening mammogram for malignant neoplasm of breast: Secondary | ICD-10-CM

## 2014-11-14 ENCOUNTER — Encounter: Payer: Self-pay | Admitting: Family Medicine

## 2014-11-14 ENCOUNTER — Ambulatory Visit (INDEPENDENT_AMBULATORY_CARE_PROVIDER_SITE_OTHER): Payer: 59 | Admitting: Family Medicine

## 2014-11-14 VITALS — BP 120/78 | HR 75 | Temp 98.0°F | Ht 65.25 in | Wt 197.0 lb

## 2014-11-14 DIAGNOSIS — Z7189 Other specified counseling: Secondary | ICD-10-CM | POA: Diagnosis not present

## 2014-11-14 DIAGNOSIS — M545 Low back pain, unspecified: Secondary | ICD-10-CM | POA: Insufficient documentation

## 2014-11-14 DIAGNOSIS — Z131 Encounter for screening for diabetes mellitus: Secondary | ICD-10-CM

## 2014-11-14 DIAGNOSIS — Z7689 Persons encountering health services in other specified circumstances: Secondary | ICD-10-CM

## 2014-11-14 LAB — HEMOGLOBIN A1C: HEMOGLOBIN A1C: 5.3 % (ref 4.6–6.5)

## 2014-11-14 NOTE — Progress Notes (Signed)
Pre visit review using our clinic review tool, if applicable. No additional management support is needed unless otherwise documented below in the visit note. 

## 2014-11-14 NOTE — Progress Notes (Signed)
HPI:  Natasha Wallace is here to establish care. Used to go [facility] - but reports they were not pleasant. Last PCP and physical: sees Dr. Billy Coast - utd on paps and physicals - does labs there as well for lipids.  Has the following chronic problems that require follow up and concerns today:  HLD: -hx of mild elevation of LDL; better this year at ob visit (100) -working on exercise and diet for this  Hypertension: -during pregnancy, no issues with her blood pressure since  DDD: -lumbar -intermittent low back pain -seeing a chiropractor and this is helping -denies: radiation now, weakness, numbness  Hx borderline diabetes: -per her report -wants to check for diabetes -denies: polyuria, polydipsia, vision changes  ROS negative for unless reported above: fevers, unintentional weight loss, hearing or vision loss, chest pain, palpitations, struggling to breath, hemoptysis, melena, hematochezia, hematuria, falls, loc, si, thoughts of self harm  Past Medical History  Diagnosis Date  . Arthritis     lumbar spine  . History of chicken pox   . Gestational hypertension   . Hyperlipidemia   . History of urinary tract infection     Past Surgical History  Procedure Laterality Date  . Cesarean section  2010  . Tab  90's  . Cesarean section  07/05/2011    Procedure: CESAREAN SECTION;  Surgeon: Lenoard Aden, MD;  Location: [facility];  Service: Gynecology;  Laterality: N/A;    Family History  Problem Relation Age of Onset  . Breast cancer Mother   . Breast cancer Maternal Aunt   . Prostate cancer Maternal Grandfather   . Heart disease Brother   . CVA Mother   . Hypertension Father     History   Social History  . Marital Status: Married    Spouse Name: N/A  . Number of Children: N/A  . Years of Education: N/A   Social History Main Topics  . Smoking status: Former Smoker    Types: Cigarettes    Quit date: 07/01/2007  . Smokeless tobacco: Not on file  . Alcohol Use: No   . Drug Use: No  . Sexual Activity: Yes   Other Topics Concern  . None   Social History Narrative   Work or School: homemaker      Home Situation: lives with husband and 2 kids 5 and 3 in 2016      Spiritual Beliefs: none      Lifestyle: no regular exercise; diet is fairly healthy           Current outpatient prescriptions:  Marland Kitchen  Multiple Vitamin (MULTIVITAMIN WITH MINERALS) TABS, Take 1 tablet by mouth daily., Disp: , Rfl:   EXAM:  Filed Vitals:   11/14/14 0924  BP: 120/78  Pulse: 75  Temp: 98 F (36.7 C)    Body mass index is 32.55 kg/(m^2).  GENERAL: vitals reviewed and listed above, alert, oriented, appears well hydrated and in no acute distress  HEENT: atraumatic, conjunttiva clear, no obvious abnormalities on inspection of external nose and ears  NECK: no obvious masses on inspection  LUNGS: clear to auscultation bilaterally, no wheezes, rales or rhonchi, good air movement  CV: HRRR, no peripheral edema  MS: moves all extremities without noticeable abnormality  PSYCH: pleasant and cooperative, no obvious depression or anxiety  ASSESSMENT AND PLAN:  Discussed the following assessment and plan:  Midline low back pain without sciatica  Encounter to establish care  Diabetes mellitus screening - Plan: Hemoglobin A1c  -discussed  causes of back pain and treatment, she is doing well now, advised of risks with cervical HVLA  -We reviewed the PMH, PSH, FH, SH, Meds and Allergies. -We provided refills for any medications we will prescribe as needed. -We addressed current concerns per orders and patient instructions. -We have asked for records for pertinent exams, studies, vaccines and notes from previous providers. -We have advised patient to follow up per instructions below.  -Patient advised to return or notify a doctor immediately if symptoms worsen or persist or new concerns arise.  Patient Instructions  Follow up as needed and yearly  We recommend  the following healthy lifestyle measures: - eat a healthy diet consisting of lots of vegetables, fruits, beans, nuts, seeds, healthy meats such as white chicken and fish and whole grains.  - avoid fried foods, fast food, processed foods, sodas, red meet and other fattening foods.  - get a least 150 minutes of aerobic exercise per week.       [name], Infant Zink R.

## 2014-11-14 NOTE — Patient Instructions (Addendum)
BEFORE YOU LEAVE: -labs  Follow up as needed and yearly  We recommend the following healthy lifestyle measures: - eat a healthy diet consisting of lots of vegetables, fruits, beans, nuts, seeds, healthy meats such as white chicken and fish and whole grains.  - avoid fried foods, fast food, processed foods, sodas, red meet and other fattening foods.  - get a least 150 minutes of aerobic exercise per week.

## 2015-02-04 ENCOUNTER — Ambulatory Visit (INDEPENDENT_AMBULATORY_CARE_PROVIDER_SITE_OTHER): Payer: 59 | Admitting: Family Medicine

## 2015-02-04 DIAGNOSIS — R69 Illness, unspecified: Secondary | ICD-10-CM

## 2015-02-04 NOTE — Progress Notes (Signed)
error 

## 2015-02-23 ENCOUNTER — Telehealth: Payer: Self-pay | Admitting: Family Medicine

## 2015-02-23 NOTE — Telephone Encounter (Signed)
Pt received 50.00 no show bill. Pt cancelled her 02-04-15 less than hour prior to appt time

## 2015-07-17 ENCOUNTER — Ambulatory Visit (INDEPENDENT_AMBULATORY_CARE_PROVIDER_SITE_OTHER): Payer: 59 | Admitting: Family Medicine

## 2015-07-17 ENCOUNTER — Encounter: Payer: Self-pay | Admitting: Family Medicine

## 2015-07-17 VITALS — BP 100/80 | HR 88 | Temp 98.1°F | Ht 65.25 in | Wt 199.2 lb

## 2015-07-17 DIAGNOSIS — R002 Palpitations: Secondary | ICD-10-CM

## 2015-07-17 DIAGNOSIS — Z23 Encounter for immunization: Secondary | ICD-10-CM | POA: Diagnosis not present

## 2015-07-17 DIAGNOSIS — R0609 Other forms of dyspnea: Secondary | ICD-10-CM

## 2015-07-17 DIAGNOSIS — E669 Obesity, unspecified: Secondary | ICD-10-CM

## 2015-07-17 DIAGNOSIS — Z8249 Family history of ischemic heart disease and other diseases of the circulatory system: Secondary | ICD-10-CM

## 2015-07-17 NOTE — Addendum Note (Signed)
Addended by: Johnella MoloneyFUNDERBURK, Cariann Kinnamon A on: 07/17/2015 10:28 AM   Modules accepted: Orders

## 2015-07-17 NOTE — Patient Instructions (Signed)
BEFORE YOU LEAVE: -EKG -flu shot -follow up in 3 months or sooner as needed  -please try to wean off caffeine  -please consider counseling (cognitive behavioral therapy) for stress  -We placed a referral for you as discussed for a cardiac stress test. It usually takes about 1-2 weeks to process and schedule this referral. If you have not heard from us regarding this appointment in 2 weeks please contact our office.  We recommend the following healthy lifestyle measures: - eat a healthy whole foods diet consisting of regular small meals composed of vegetables, fruits, beans, nuts, seeds, healthy meats such as white chicken and fish and whole grains.  - avoid sweets, white starchy foods, fried foods, fast food, processed foods, sodas, red meet and other fattening foods.  - get a least 150-300 minutes of aerobic exercise per week.

## 2015-07-17 NOTE — Progress Notes (Signed)
Pre visit review using our clinic review tool, if applicable. No additional management support is needed unless otherwise documented below in the visit note. 

## 2015-07-17 NOTE — Progress Notes (Signed)
HPI:  Acute visit for:  Palpitations/DOE/Stress: -palpitations on and off for about 1 month, occur several times per week, usually at rest -has been under more stress and drinking caffeine which she feels is likely the cause, but brother had MI at age 38 and wanted her to have eval -no regular exercise and occasionally feels SOB on stairs -hx of panic disorder as a child -denies: CP, SOB otherwise, swelling, malaise, depression, thoughts of self harm  ROS: See pertinent positives and negatives per HPI.  Past Medical History  Diagnosis Date  . Arthritis     lumbar spine  . History of chicken pox   . Gestational hypertension   . Hyperlipidemia   . History of urinary tract infection     Past Surgical History  Procedure Laterality Date  . Cesarean section  2010  . Tab  90's  . Cesarean section  07/05/2011    Procedure: CESAREAN SECTION;  Surgeon: Lenoard Aden, MD;  Location: [facility];  Service: Gynecology;  Laterality: N/A;    Family History  Problem Relation Age of Onset  . Breast cancer Mother   . Breast cancer Maternal Aunt   . Prostate cancer Maternal Grandfather   . Heart disease Brother   . CVA Mother   . Hypertension Father     Social History   Social History  . Marital Status: Married    Spouse Name: N/A  . Number of Children: N/A  . Years of Education: N/A   Social History Main Topics  . Smoking status: Former Smoker    Types: Cigarettes    Quit date: 07/01/2007  . Smokeless tobacco: None  . Alcohol Use: No  . Drug Use: No  . Sexual Activity: Yes   Other Topics Concern  . None   Social History Narrative   Work or School: homemaker      Home Situation: lives with husband and 2 kids 5 and 3 in 2016      Spiritual Beliefs: none      Lifestyle: no regular exercise; diet is fairly healthy           Current outpatient prescriptions:  Marland Kitchen  Multiple Vitamin (MULTIVITAMIN WITH MINERALS) TABS, Take 1 tablet by mouth daily., Disp: , Rfl:    EXAM:  Filed Vitals:   07/17/15 0916  BP: 100/80  Pulse: 88  Temp: 98.1 F (36.7 C)    Body mass index is 32.91 kg/(m^2).  GENERAL: vitals reviewed and listed above, alert, oriented, appears well hydrated and in no acute distress  HEENT: atraumatic, conjunttiva clear, no obvious abnormalities on inspection of external nose and ears  NECK: no obvious masses on inspection  LUNGS: clear to auscultation bilaterally, no wheezes, rales or rhonchi, good air movement  CV: HRRR, no peripheral edema  MS: moves all extremities without noticeable abnormality  PSYCH: pleasant and cooperative, no obvious depression or anxiety  ASSESSMENT AND PLAN:  Discussed the following assessment and plan:  DOE (dyspnea on exertion) - Plan: EKG 12-Lead  Palpitations  FH: CAD (coronary artery disease)  Obesity  -EKG today with NSR, stress test for her concerns and DOE - ischemic cause unlikely -discussed lifestyle changes for reduction of CV risks -advised stress management, CBT, regular exercise, healthy diet, weight reduction, caffeine reduction -discussed further workup for palpitations, tx options if not resolving or worsening -lipid and diabetes screening done this year per [facility] -Patient advised to return or notify a doctor immediately if symptoms worsen or persist or new concerns  arise.  Patient Instructions  BEFORE YOU LEAVE: -EKG -flu shot -follow up in 3 months or sooner as needed  -please try to wean off caffeine  -please consider counseling (cognitive behavioral therapy) for stress  -We placed a referral for you as discussed for a cardiac stress test. It usually takes about 1-2 weeks to process and schedule this referral. If you have not heard from us regarding this appointment in 2 weeks please contact our office.  We recommend the following healthy lifestyle measures: - eat a healthy whole foods diet consisting of regular small meals composed of vegetables, fruits, beans,  nuts, seeds, healthy meats such as white chicken and fish and whole grains.  - avoid sweets, white starchy foods, fried foods, fast food, processed foods, sodas, red meet and other fattening foods.  - get a least 150-300 minutes of aerobic exercise per week.        [name], [name]

## 2015-08-03 ENCOUNTER — Ambulatory Visit (INDEPENDENT_AMBULATORY_CARE_PROVIDER_SITE_OTHER): Payer: 59

## 2015-08-03 ENCOUNTER — Encounter: Payer: 59 | Admitting: Cardiology

## 2015-08-03 DIAGNOSIS — R0609 Other forms of dyspnea: Secondary | ICD-10-CM

## 2015-08-03 LAB — EXERCISE TOLERANCE TEST
CHL CUP RESTING HR STRESS: 88 {beats}/min
CHL RATE OF PERCEIVED EXERTION: 15
CSEPED: 9 min
CSEPEDS: 20 s
CSEPPHR: 171 {beats}/min
Estimated workload: 10.6 METS
MPHR: 182 {beats}/min
Percent HR: 94 %

## 2015-08-12 ENCOUNTER — Telehealth: Payer: Self-pay | Admitting: Family Medicine

## 2015-08-12 NOTE — Telephone Encounter (Signed)
Pt hasn't get better with antibiotic. Please advise

## 2015-08-12 NOTE — Telephone Encounter (Signed)
TELEPHONE ADVICE RECORD [facility]  Patient Name: Natasha Wallace  DOB: 06/01/1976    Initial Comment Caller states she has been sick for over a week and has been on Amoxicillin for 6 days but is not feeling better for cold symptoms. Increased amount of congestion in nose, has weird squeaking noise inside nose and ears keep popping.   Nurse Assessment  Nurse: Odis LusterBowers, RN, Bjorn Loserhonda Date/Time Lamount Cohen(Eastern Time): 08/12/2015 3:54:45 PM  Confirm and document reason for call. If symptomatic, describe symptoms. ---Caller states she has been sick for over a week and has been on Amoxicillin for 6 days but is not feeling better for cold symptoms. Increased amount of congestion in nose, has weird squeaking noise inside nose and ears keep popping. Reports that she was recently given an inhaler for cough/wheezing. Chest congestion is better, but nasal congestion is worse.  Has the patient traveled out of the country within the last 30 days? ---No  Does the patient have any new or worsening symptoms? ---Yes  Will a triage be completed? ---Yes  Related visit to physician within the last 2 weeks? ---Yes  Does the PT have any chronic conditions? (i.e. diabetes, asthma, etc.) ---No  Did the patient indicate they were pregnant? ---No  Is this a behavioral health or substance abuse call? ---No     Guidelines    Guideline Title Affirmed Question Affirmed Notes  Sinus Infection on Antibiotic Follow-up Call [1] Taking antibiotic AND [2] nose still blocked (all triage questions negative)    Final Disposition User   Home Care Odis LusterBowers, RN, Bjorn Loserhonda    Disagree/Comply: Danella Maiersomply

## 2015-08-13 NOTE — Telephone Encounter (Signed)
There are no antibiotics that treat a cold and they usually last for 1-3 weeks. If she is feeling worse however, do advise appointment.

## 2015-08-13 NOTE — Telephone Encounter (Signed)
Spoke to pt and she said if she needs an appt she will call

## 2015-08-20 ENCOUNTER — Ambulatory Visit (INDEPENDENT_AMBULATORY_CARE_PROVIDER_SITE_OTHER): Payer: 59 | Admitting: Family Medicine

## 2015-08-20 ENCOUNTER — Encounter: Payer: Self-pay | Admitting: Family Medicine

## 2015-08-20 VITALS — BP 118/92 | HR 90 | Temp 98.0°F | Ht 65.25 in | Wt 203.5 lb

## 2015-08-20 DIAGNOSIS — J309 Allergic rhinitis, unspecified: Secondary | ICD-10-CM | POA: Diagnosis not present

## 2015-08-20 DIAGNOSIS — R030 Elevated blood-pressure reading, without diagnosis of hypertension: Secondary | ICD-10-CM | POA: Diagnosis not present

## 2015-08-20 DIAGNOSIS — R222 Localized swelling, mass and lump, trunk: Secondary | ICD-10-CM | POA: Diagnosis not present

## 2015-08-20 DIAGNOSIS — IMO0001 Reserved for inherently not codable concepts without codable children: Secondary | ICD-10-CM

## 2015-08-20 NOTE — Addendum Note (Signed)
Addended by: Terressa KoyanagiKIM, Reuven Braver R on: 08/20/2015 03:31 PM   Modules accepted: Level of Service

## 2015-08-20 NOTE — Patient Instructions (Addendum)
BEFORE YOU LEAVE: -recheck blood pressure after sitting 5 minutes -schedule follow up in 1 month  -We placed a referral for you as discussed for an ultrasound and once we have more information will be able to get surgical consult if needed. If redness, swelling, malaise, fevers please seek care immediately. It usually takes about 1-2 weeks to process and schedule this referral. If you have not heard from us regarding this appointment in 2 weeks please contact our office.  For the nasal congestion: -AFRIN for 4 days then STOP -allegra daily -flonase daily -follow up in 1 month or sooner if worsening or other concerns

## 2015-08-20 NOTE — Progress Notes (Signed)
Pre visit review using our clinic review tool, if applicable. No additional management support is needed unless otherwise documented below in the visit note. 

## 2015-08-20 NOTE — Progress Notes (Addendum)
HPI:  Acute visit for:  Lump on abdomen: -not sure how long it has been there -L upper abdomen, feels sore if she presses on it -no redness, malaise, fevers, chills, skin lesion  Nasal congestion: -for over 1 month -abx from [facility] did not help -clear rhinorrhea, sneezing, occ cough, PND -denies: fevers, chills, malaise, sinus pain, ear pain, SOB, wheezing, tooth pain  Elevated blood pressure: -no SOB, DOE, swelling   ROS: See pertinent positives and negatives per HPI.  Past Medical History  Diagnosis Date  . Arthritis     lumbar spine  . History of chicken pox   . Gestational hypertension   . Hyperlipidemia   . History of urinary tract infection     Past Surgical History  Procedure Laterality Date  . Cesarean section  2010  . Tab  90's  . Cesarean section  07/05/2011    Procedure: CESAREAN SECTION;  Surgeon: Lenoard Aden, MD;  Location: [facility];  Service: Gynecology;  Laterality: N/A;    Family History  Problem Relation Age of Onset  . Breast cancer Mother   . Breast cancer Maternal Aunt   . Prostate cancer Maternal Grandfather   . Heart disease Brother   . CVA Mother   . Hypertension Father     Social History   Social History  . Marital Status: Married    Spouse Name: N/A  . Number of Children: N/A  . Years of Education: N/A   Social History Main Topics  . Smoking status: Former Smoker    Types: Cigarettes    Quit date: 07/01/2007  . Smokeless tobacco: None  . Alcohol Use: No  . Drug Use: No  . Sexual Activity: Yes   Other Topics Concern  . None   Social History Narrative   Work or School: homemaker      Home Situation: lives with husband and 2 kids 5 and 3 in 2016      Spiritual Beliefs: none      Lifestyle: no regular exercise; diet is fairly healthy           Current outpatient prescriptions:  Marland Kitchen  Multiple Vitamin (MULTIVITAMIN WITH MINERALS) TABS, Take 1 tablet by mouth daily., Disp: , Rfl:   EXAM:  Filed Vitals:    08/20/15 1508  BP: 132/92  Pulse: 90  Temp: 98 F (36.7 C)    Body mass index is 33.62 kg/(m^2).  GENERAL: vitals reviewed and listed above, alert, oriented, appears well hydrated and in no acute distress  HEENT: atraumatic, conjunttiva clear, no obvious abnormalities on inspection of external nose and ears, normal appearance of ear canals and TMs, clear nasal congestion, mild post oropharyngeal erythema with PND, no tonsillar edema or exudate, no sinus TTP  NECK: no obvious masses on inspection  LUNGS: clear to auscultation bilaterally, no wheezes, rales or rhonchi, good air movement  CV: HRRR, no peripheral edema  ABD/CHEST WALL: soft 7 cm in diameter mass L L chest wall/L upper abd - no overlying redness, warmth or induration, no skin changes  MS: moves all extremities without noticeable abnormality  PSYCH: pleasant and cooperative, no obvious depression or anxiety  ASSESSMENT AND PLAN:  Discussed the following assessment and plan:  Mass of chest wall, left - Plan: [place] Chest -query cystic or fatty structure? No redness/warmth/induration to suggest abscess/infection or inflammation -opted to obtain US to help define/possible following surgical eval as she desires removal  Allergic rhinitis, unspecified allergic rhinitis type -suspected AR versus recurrent VURI -  multiple family members with one cold after the other -supportive care, INS, antihistamin, teturn precautions  Elevated Blood pressure: -recheck seated -stressed today -follow up in 1 month  -Patient advised to return or notify a doctor immediately if symptoms worsen or persist or new concerns arise.  Patient Instructions  BEFORE YOU LEAVE: -recheck blood pressure after sitting 5 minutes -schedule follow up in 1 month  -We placed a referral for you as discussed for an ultrasound and once we have more information will be able to get surgical consult if needed. If redness, swelling, malaise, fevers please seek  care immediately. It usually takes about 1-2 weeks to process and schedule this referral. If you have not heard from us regarding this appointment in 2 weeks please contact our office.  For the nasal congestion: -AFRIN for 4 days then STOP -allegra daily -flonase daily -follow up in 1 month or sooner if worsening or other concerns      Eula Mazzola R.

## 2015-08-26 ENCOUNTER — Telehealth: Payer: Self-pay | Admitting: Family Medicine

## 2015-08-26 ENCOUNTER — Ambulatory Visit
Admission: RE | Admit: 2015-08-26 | Discharge: 2015-08-26 | Disposition: A | Discharge status: Home / Self Care | Payer: 59 | Source: Ambulatory Visit | Attending: Family Medicine | Admitting: Family Medicine

## 2015-08-26 DIAGNOSIS — R222 Localized swelling, mass and lump, trunk: Secondary | ICD-10-CM

## 2015-08-26 NOTE — Telephone Encounter (Signed)
Pt would like results of ultrasound she had today

## 2015-08-27 ENCOUNTER — Telehealth: Payer: Self-pay | Admitting: Family Medicine

## 2015-08-27 NOTE — Telephone Encounter (Signed)
Normal exam.  No mass mentioned.  Follow up with Dr Selena BattenKim

## 2015-08-27 NOTE — Telephone Encounter (Signed)
See note from 12/22.

## 2015-08-27 NOTE — Telephone Encounter (Signed)
Patient informed. 

## 2015-08-27 NOTE — Telephone Encounter (Signed)
Natasha Wallace called to see if she can get the [place] to [place] She had yesterday before Dr. Selena BattenKim returns next week. She's wondering if another provider can give her the results since the [place] "could lead to another test she may need to see if something is malignant." She'd like a phone call regarding this today if possible.  Pt's ph# [phone] Thank you.

## 2016-10-03 ENCOUNTER — Other Ambulatory Visit: Payer: Self-pay | Admitting: Obstetrics and Gynecology

## 2016-10-03 DIAGNOSIS — Z1231 Encounter for screening mammogram for malignant neoplasm of breast: Secondary | ICD-10-CM

## 2016-10-03 DIAGNOSIS — Z803 Family history of malignant neoplasm of breast: Secondary | ICD-10-CM

## 2016-11-04 ENCOUNTER — Ambulatory Visit
Admission: RE | Admit: 2016-11-04 | Discharge: 2016-11-04 | Disposition: A | Discharge status: Home / Self Care | Payer: 59 | Source: Ambulatory Visit | Attending: Obstetrics and Gynecology | Admitting: Obstetrics and Gynecology

## 2016-11-04 DIAGNOSIS — Z1231 Encounter for screening mammogram for malignant neoplasm of breast: Secondary | ICD-10-CM

## 2016-11-04 DIAGNOSIS — Z803 Family history of malignant neoplasm of breast: Secondary | ICD-10-CM

## 2016-11-08 DIAGNOSIS — Z13220 Encounter for screening for lipoid disorders: Secondary | ICD-10-CM | POA: Diagnosis not present

## 2016-11-08 DIAGNOSIS — Z01419 Encounter for gynecological examination (general) (routine) without abnormal findings: Secondary | ICD-10-CM | POA: Diagnosis not present

## 2017-02-20 DIAGNOSIS — L732 Hidradenitis suppurativa: Secondary | ICD-10-CM | POA: Diagnosis not present

## 2017-09-12 DIAGNOSIS — J029 Acute pharyngitis, unspecified: Secondary | ICD-10-CM | POA: Diagnosis not present

## 2017-11-08 ENCOUNTER — Other Ambulatory Visit: Payer: Self-pay | Admitting: Obstetrics and Gynecology

## 2017-11-08 DIAGNOSIS — Z1231 Encounter for screening mammogram for malignant neoplasm of breast: Secondary | ICD-10-CM

## 2017-12-04 ENCOUNTER — Ambulatory Visit
Admission: RE | Admit: 2017-12-04 | Discharge: 2017-12-04 | Disposition: A | Discharge status: Home / Self Care | Payer: 59 | Source: Ambulatory Visit | Attending: Obstetrics and Gynecology | Admitting: Obstetrics and Gynecology

## 2017-12-04 DIAGNOSIS — Z1231 Encounter for screening mammogram for malignant neoplasm of breast: Secondary | ICD-10-CM | POA: Diagnosis not present

## 2017-12-05 ENCOUNTER — Ambulatory Visit: Payer: 59

## 2017-12-05 DIAGNOSIS — H10413 Chronic giant papillary conjunctivitis, bilateral: Secondary | ICD-10-CM | POA: Diagnosis not present

## 2017-12-13 DIAGNOSIS — Z803 Family history of malignant neoplasm of breast: Secondary | ICD-10-CM | POA: Diagnosis not present

## 2017-12-13 DIAGNOSIS — Z01419 Encounter for gynecological examination (general) (routine) without abnormal findings: Secondary | ICD-10-CM | POA: Diagnosis not present

## 2018-12-11 DIAGNOSIS — R300 Dysuria: Secondary | ICD-10-CM | POA: Diagnosis not present

## 2020-11-06 ENCOUNTER — Other Ambulatory Visit: Payer: Self-pay | Admitting: Obstetrics and Gynecology

## 2020-11-06 DIAGNOSIS — Z1231 Encounter for screening mammogram for malignant neoplasm of breast: Secondary | ICD-10-CM

## 2020-12-29 ENCOUNTER — Ambulatory Visit
Admission: RE | Admit: 2020-12-29 | Discharge: 2020-12-29 | Disposition: A | Discharge status: Home / Self Care | Payer: 59 | Source: Ambulatory Visit | Attending: Obstetrics and Gynecology | Admitting: Obstetrics and Gynecology

## 2020-12-29 ENCOUNTER — Other Ambulatory Visit: Payer: Self-pay

## 2020-12-29 DIAGNOSIS — Z1231 Encounter for screening mammogram for malignant neoplasm of breast: Secondary | ICD-10-CM

## 2020-12-31 ENCOUNTER — Other Ambulatory Visit: Payer: Self-pay | Admitting: Obstetrics and Gynecology

## 2020-12-31 DIAGNOSIS — R928 Other abnormal and inconclusive findings on diagnostic imaging of breast: Secondary | ICD-10-CM

## 2021-01-04 ENCOUNTER — Other Ambulatory Visit: Payer: Self-pay

## 2021-01-04 ENCOUNTER — Ambulatory Visit: Admission: RE | Admit: 2021-01-04 | Payer: BC Managed Care – PPO | Source: Ambulatory Visit

## 2021-01-04 ENCOUNTER — Ambulatory Visit
Admission: RE | Admit: 2021-01-04 | Discharge: 2021-01-04 | Disposition: A | Discharge status: Home / Self Care | Payer: BC Managed Care – PPO | Source: Ambulatory Visit | Attending: Obstetrics and Gynecology | Admitting: Obstetrics and Gynecology

## 2021-01-04 DIAGNOSIS — R928 Other abnormal and inconclusive findings on diagnostic imaging of breast: Secondary | ICD-10-CM

## 2021-01-21 ENCOUNTER — Other Ambulatory Visit: Payer: BC Managed Care – PPO

## 2023-03-05 IMAGING — MG MM DIGITAL SCREENING BILAT W/ TOMO AND CAD
8 series · 8 of 24 positions shown · non-contrast
Comparison: Previous exam(s).

CLINICAL DATA: Screening.

EXAM:
DIGITAL SCREENING BILATERAL MAMMOGRAM WITH TOMOSYNTHESIS AND CAD
TECHNIQUE: Bilateral screening digital craniocaudal and mediolateral oblique
mammograms were obtained. Bilateral screening digital breast
tomosynthesis was performed. The images were evaluated with
computer-aided detection.

[R MLO synth-2D]
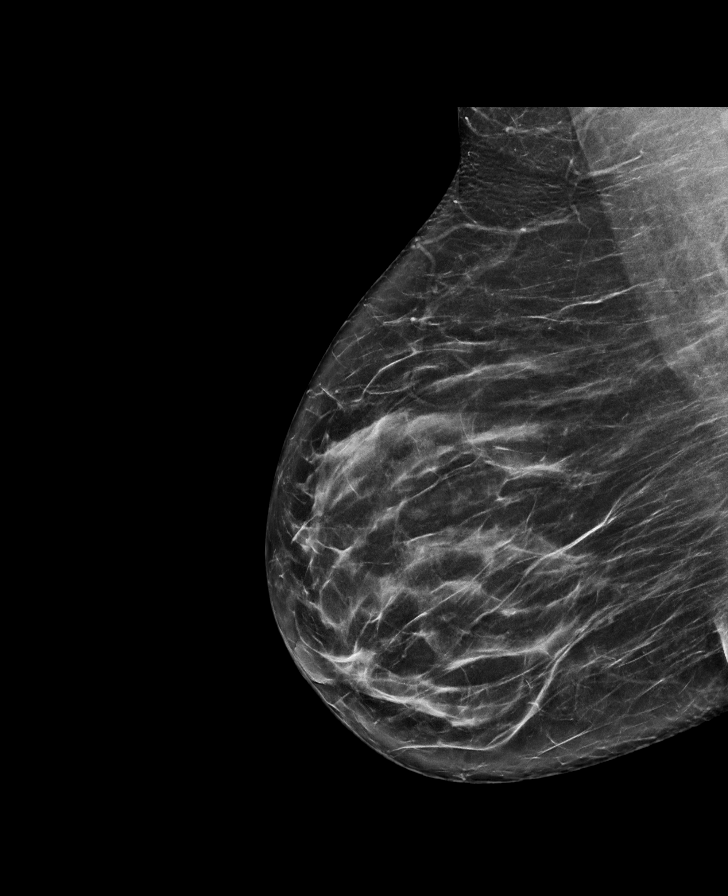

[R CC synth-2D]
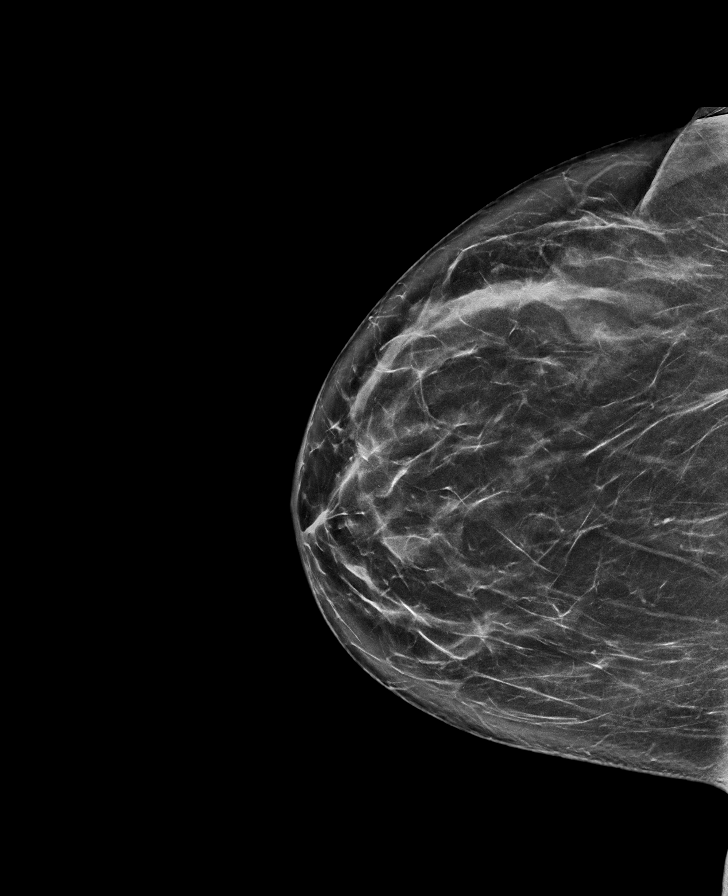

[L CC synth-2D]
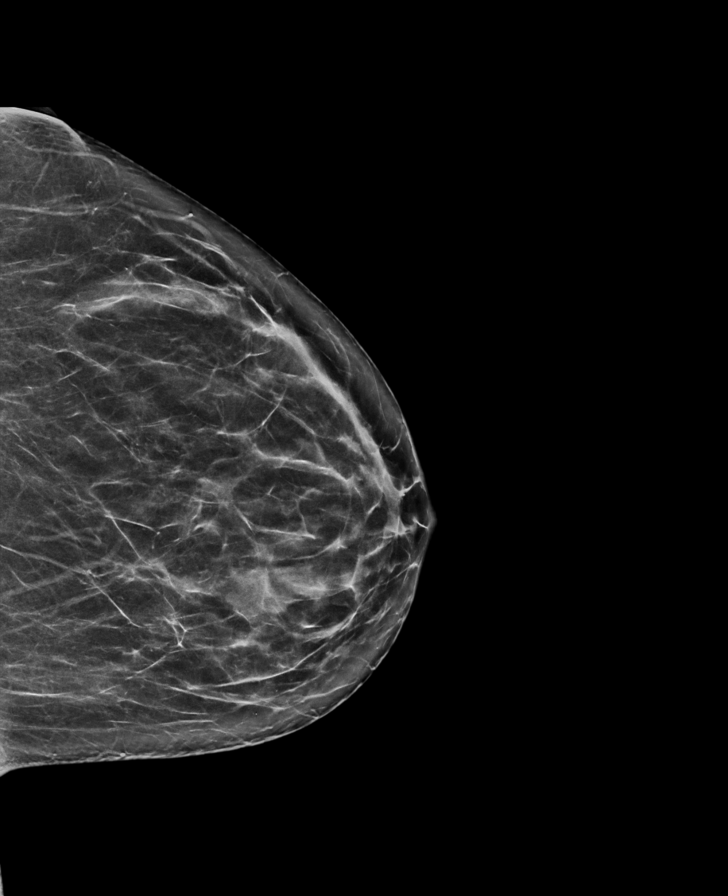

[L MLO synth-2D]
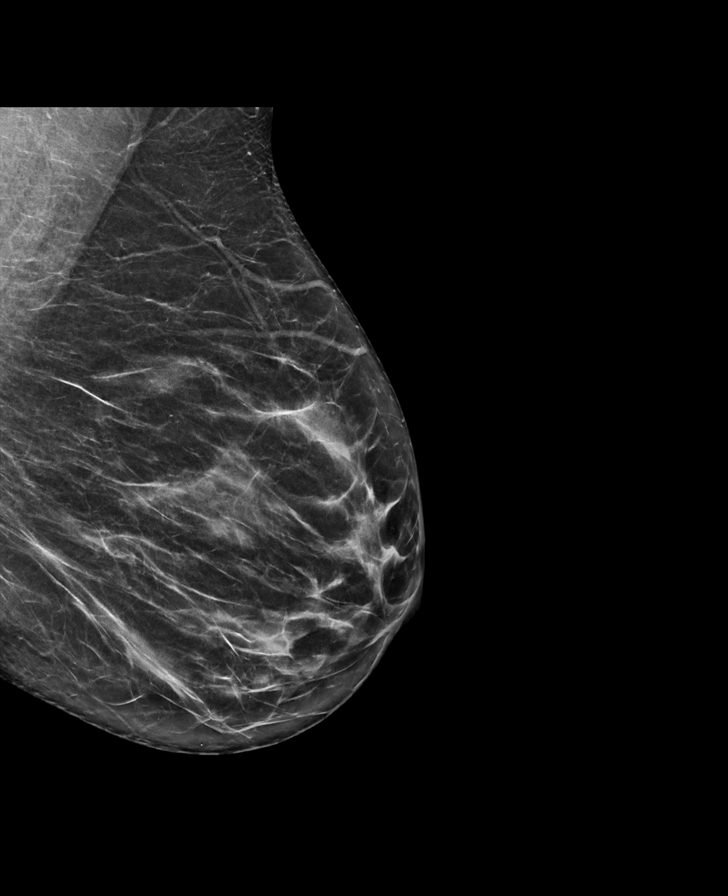

[R CC tomo · tomo slice 37/73.0]
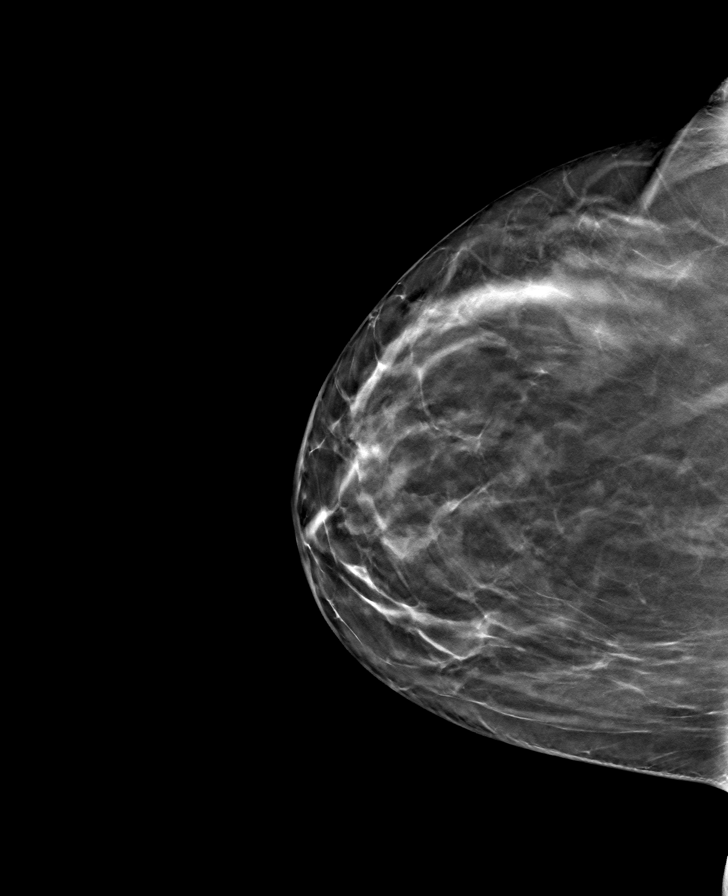

[L MLO tomo · tomo slice 35/70.0]
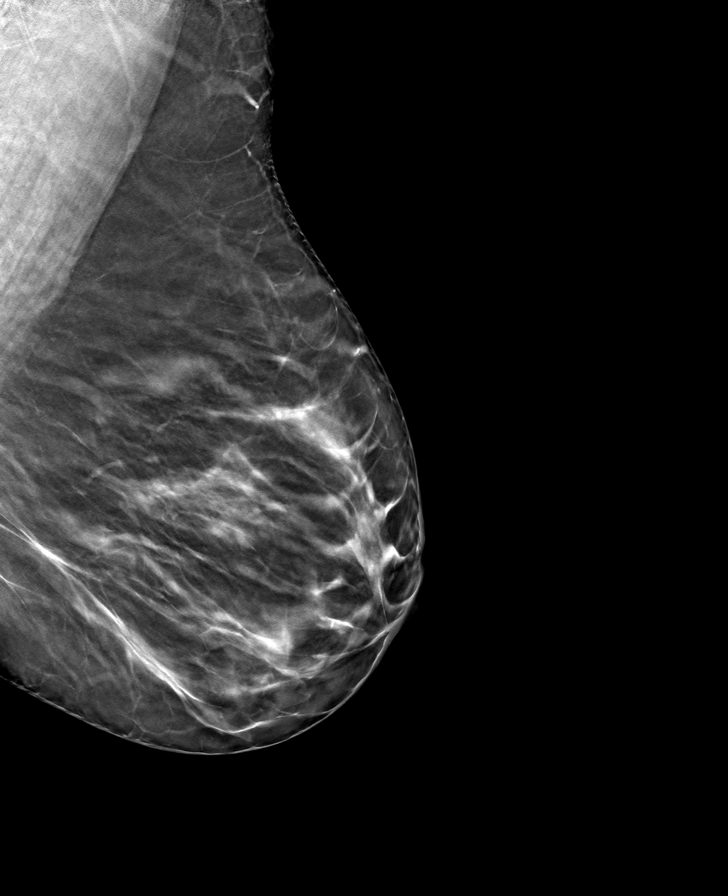

[R MLO tomo · tomo slice 38/75.0]
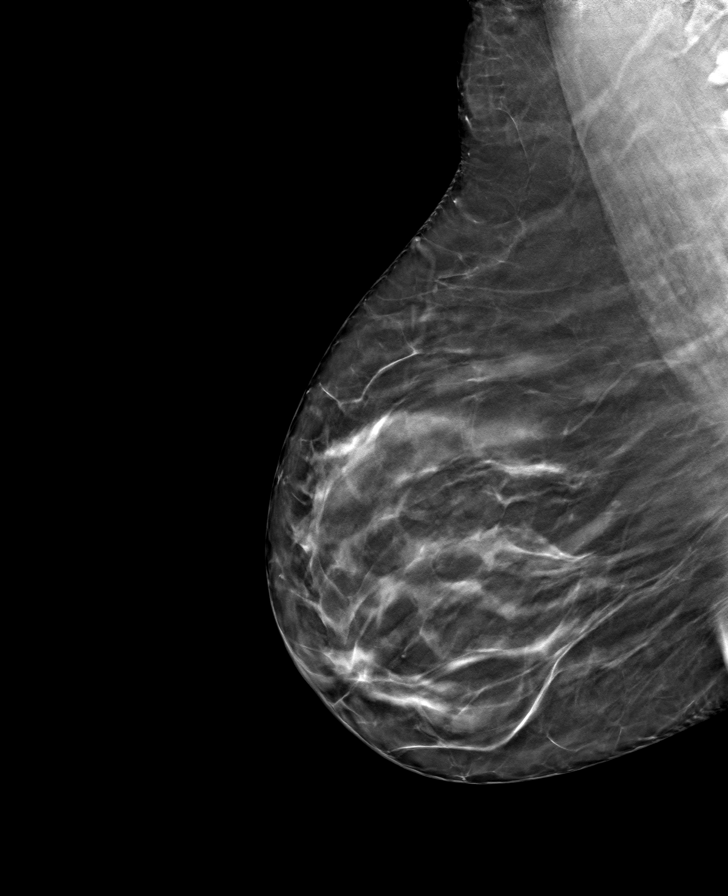

[L CC tomo · tomo slice 35/68.0]
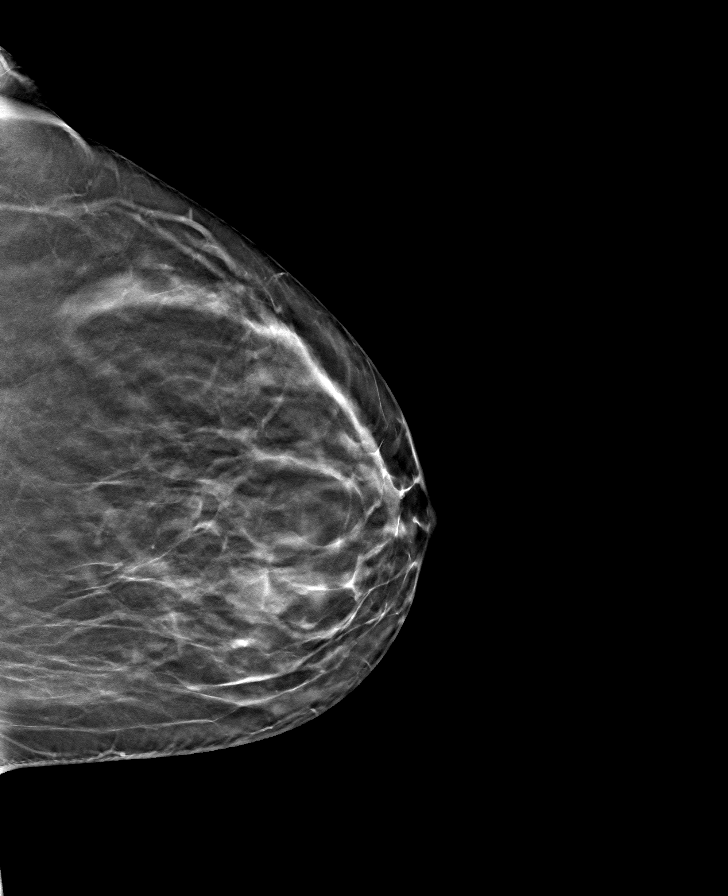

[8 of 24 positions shown; findings below may reference images not displayed]

ACR Breast Density Category c: The breast tissue is heterogeneously
dense, which may obscure small masses.
FINDINGS: In the right breast, a possible asymmetry warrants further
evaluation. In the left breast, no findings suspicious for
malignancy.
IMPRESSION: Further evaluation is suggested for possible asymmetry in the right
breast.

RECOMMENDATION:
Diagnostic mammogram and possibly ultrasound of the right breast.
(Code:06-Y-99A)

The patient will be contacted regarding the findings, and additional
imaging will be scheduled.

BI-RADS CATEGORY  0: Incomplete. Need additional imaging evaluation
and/or prior mammograms for comparison.

## 2024-05-07 ENCOUNTER — Other Ambulatory Visit: Payer: Self-pay | Admitting: Obstetrics and Gynecology

## 2024-05-07 DIAGNOSIS — Z1231 Encounter for screening mammogram for malignant neoplasm of breast: Secondary | ICD-10-CM

## 2024-06-11 ENCOUNTER — Ambulatory Visit
Admission: RE | Admit: 2024-06-11 | Discharge: 2024-06-11 | Disposition: A | Discharge status: Home / Self Care | Source: Ambulatory Visit | Attending: Obstetrics and Gynecology | Admitting: Obstetrics and Gynecology

## 2024-06-11 DIAGNOSIS — Z1231 Encounter for screening mammogram for malignant neoplasm of breast: Secondary | ICD-10-CM
